# Patient Record
Sex: Female | Born: 2000
Health system: Southern US, Community
[De-identification: ages and names within clinical notes are randomized; demographics above are authoritative.]

## PROBLEM LIST (undated history)

## (undated) DIAGNOSIS — R51 Headache: Secondary | ICD-10-CM

## (undated) HISTORY — PX: TONGUE FLAP RELEASE: SHX2537

## (undated) HISTORY — DX: Headache: R51

---
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
## 2019-06-27 ENCOUNTER — Other Ambulatory Visit: Payer: Self-pay

## 2019-06-27 ENCOUNTER — Ambulatory Visit (HOSPITAL_COMMUNITY)
Admission: EM | Admit: 2019-06-27 | Discharge: 2019-06-27 | Disposition: A | Discharge status: Home / Self Care | Payer: 59 | Attending: Family Medicine | Admitting: Family Medicine

## 2019-06-27 ENCOUNTER — Encounter (HOSPITAL_COMMUNITY): Payer: Self-pay

## 2019-06-27 DIAGNOSIS — R1084 Generalized abdominal pain: Secondary | ICD-10-CM | POA: Insufficient documentation

## 2019-06-27 DIAGNOSIS — Z3202 Encounter for pregnancy test, result negative: Secondary | ICD-10-CM

## 2019-06-27 LAB — POCT PREGNANCY, URINE: Preg Test, Ur: NEGATIVE

## 2019-06-27 LAB — POCT URINALYSIS DIP (DEVICE)
Bilirubin Urine: NEGATIVE
Glucose, UA: NEGATIVE mg/dL
Hgb urine dipstick: NEGATIVE
Ketones, ur: NEGATIVE mg/dL
Leukocytes,Ua: NEGATIVE
Nitrite: NEGATIVE
Protein, ur: NEGATIVE mg/dL
Specific Gravity, Urine: 1.025 (ref 1.005–1.030)
Urobilinogen, UA: 1 mg/dL (ref 0.0–1.0)
pH: 7 (ref 5.0–8.0)

## 2019-06-27 LAB — POC URINE PREG, ED: Preg Test, Ur: NEGATIVE

## 2019-06-27 MED ORDER — OMEPRAZOLE 40 MG PO CPDR: 40.0000 mg | DELAYED_RELEASE_CAPSULE | Freq: Every day | ORAL | 0 refills | Status: AC

## 2019-06-27 NOTE — Discharge Instructions (Addendum)
Your exam is overall reassuring today.  Your urine does not indicate any urinary tract infection.  We will test your vagina again for BV as well as std's.  Please start daily omeprazole, we will see if this helps symptoms.  Please follow up with your primary care provider if symptoms persist as you may need further evaluation.  Limit use of ibuprofen or aleve (naproxen), use of tylenol as needed.  Please go to the Er if symptoms worsen at all- increased pain, fevers or otherwise worsening.

## 2019-06-27 NOTE — ED Triage Notes (Signed)
Pt presents with complaints of abdominal and vaginal pain of and on since October. Pt was diagnosed with gonorrhea, chlamydia and bacterial vaginosis in October and received treatment for it. She states the medicine for BV made her throw up and she is concerned she did not get completely treated.

## 2019-06-27 NOTE — ED Provider Notes (Signed)
[facility]    CSN: [id number] Arrival date & time: 06/27/19  1723      History   Chief Complaint Chief Complaint  Patient presents with  . Abdominal Pain    HPI Elizabeth Hill is a 18 y.o. female.   Elizabeth Hill presents with complaints of generalized abdominal pain. She has had episodes of this since may. In  May she did have a UTI and was treated with mild improvement. Returned in October, she was tested and treated for BV, chlamydia, gonorrha. Her partner had cheated on her. She has not been sexually active since May. No new partners. She was unable to complete course of flagyl for bv as it made her vomit. Over the past 4 days she has had pain worsening. She experiences intermittent pain that shoots to vagina, as well as generalized abdominal pain. Improves with eating. No nausea or vomiting. Normal bowel movements. No fevers. Hasn't taken any medications for symptoms. No urinary symptoms. Some back pain, history of scoliosis. No new vaginal symptoms. LMP 3 weeks ago and has been regular, she is taking oral birth control. She does have an appointment with her PCP in 10 days.   ROS per HPI, negative if not otherwise mentioned.      Past Medical History:  Diagnosis Date  . [id number](478.2Headache(784.0)     Patient Active Problem List   Diagnosis Date Noted  . Migraine without aura and without status migrainosus, not intractable 04/21/2016    Past Surgical History:  Procedure Laterality Date  . TONGUE FLAP RELEASE      OB History   No obstetric history on file.      Home Medications    Prior to Admission medications   Medication Sig Start Date End Date Taking? Authorizing Provider  APRI 0.15-30 MG-MCG tablet Take 1 tablet by mouth daily. 04/02/16   [provider]  HYDROcodone-acetaminophen (HYCET) 7.5-325 mg/15 ml solution Take 7.5 mLs by mouth 4 (four) times daily as needed for moderate pain. Patient not taking: Reported on 04/21/2016 10/03/13   Niel HummerKuhner,  Ross, MD  ibuprofen (ADVIL,MOTRIN) 200 MG tablet Take 200 mg by mouth every 6 (six) hours as needed for cramping.    [provider]  omeprazole (PRILOSEC) 40 MG capsule Take 1 capsule (40 mg total) by mouth daily. 06/27/19   Georgetta HaberBurky,  B, NP  ondansetron (ZOFRAN) 4 MG tablet Take 4 mg by mouth every 8 (eight) hours as needed for nausea.  10/01/13   [provider]  promethazine (PHENERGAN) 12.5 MG tablet 1-2 tablets as needed for headache/nausea. 11/08/16   Lorenz CoasterWolfe, Stephanie, MD  rizatriptan (MAXALT-MLT) 10 MG disintegrating tablet Take 1 tablet (10 mg total) by mouth as needed for migraine. May repeat in 2 hours if needed 11/08/16   Lorenz CoasterWolfe, Stephanie, MD    Family History Family History  Problem Relation Age of Onset  . Healthy Mother   . Healthy Father   . Diabetes Maternal Grandmother   . Heart disease Maternal Grandmother   . Hyperlipidemia Maternal Grandmother   . Hypertension Maternal Grandmother   . Migraines Maternal Grandmother   . Seizures Neg Hx   . Depression Neg Hx   . Anxiety disorder Neg Hx   . Bipolar disorder Neg Hx   . Schizophrenia Neg Hx   . ADD / ADHD Neg Hx   . Autism Neg Hx   . Drug abuse Neg Hx     Social History Social History   Tobacco Use  .  Smoking status: Never Smoker  . Smokeless tobacco: Never Used  Substance Use Topics  . Alcohol use: Not on file  . Drug use: Not on file     Allergies   Patient has no known allergies.   Review of Systems Review of Systems   Physical Exam Triage Vital Signs ED Triage Vitals  Enc Vitals Group     BP 06/27/19 1804 132/84     Pulse Rate 06/27/19 1804 89     Resp 06/27/19 1804 18     Temp 06/27/19 1804 98.7 F (37.1 C)     Temp src --      SpO2 06/27/19 1804 99 %     Weight --      Height --      Head Circumference --      Peak Flow --      Pain Score 06/27/19 1801 7     Pain Loc --      Pain Edu? --      Excl. in [place]? --    No data found.  Updated Vital Signs BP 132/84    Pulse 89   Temp 98.7 F (37.1 C)   Resp 18   LMP 05/28/2019   SpO2 99%    Physical Exam Constitutional:      General: She is not in acute distress.    Appearance: She is well-developed.  Cardiovascular:     Rate and Rhythm: Normal rate.  Pulmonary:     Effort: Pulmonary effort is normal.  Abdominal:     Palpations: Abdomen is soft.     Tenderness: There is generalized abdominal tenderness. There is no right CVA tenderness, left CVA tenderness, guarding or rebound.     Comments: No significant abdominal pain but generalized "pressure" with palpation to abdomen; some muscular tenderness to upper and lower back  Skin:    General: Skin is warm and dry.  Neurological:     Mental Status: She is alert and oriented to person, place, and time.      UC Treatments / Results  Labs (all labs ordered are listed, but only abnormal results are displayed) Labs Reviewed  POC URINE PREG, ED  POC URINE PREG, ED  POCT PREGNANCY, URINE  POCT URINALYSIS DIP (DEVICE)  CERVICOVAGINAL ANCILLARY ONLY    EKG   Radiology No results found.  Procedures Procedures (including critical care time)  Medications Ordered in UC Medications - No data to display  Initial Impression / Assessment and Plan / UC Course  I have reviewed the triage vital signs and the nursing notes.  Pertinent labs & imaging results that were available during my care of the patient were reviewed by me and considered in my medical decision making (see chart for details).     Intermittent abdominal symptoms for a few months now. No red flag findings. Vitals stable. UA without acute findings here tonight. Vaginal cytology collected and pending as well. Patient endorses significant stress related to school and her previous relationship, gastritis vs ulcer considered as well with omeprazole initiated. encouraged close follow up. Return precautions provided. Patient verbalized understanding and agreeable to plan.   Final  Clinical Impressions(s) / UC Diagnoses   Final diagnoses:  Generalized abdominal pain     Discharge Instructions     Your exam is overall reassuring today.  Your urine does not indicate any urinary tract infection.  We will test your vagina again for BV as well as std's.  Please start daily omeprazole, we  will see if this helps symptoms.  Please follow up with your primary care provider if symptoms persist as you may need further evaluation.  Limit use of ibuprofen or aleve (naproxen), use of tylenol as needed.  Please go to the Er if symptoms worsen at all- increased pain, fevers or otherwise worsening.    ED Prescriptions    Medication Sig Dispense Auth. Provider   omeprazole (PRILOSEC) 40 MG capsule Take 1 capsule (40 mg total) by mouth daily. 30 capsule Georgetta Haber, NP     PDMP not reviewed this encounter.   Georgetta Haber, NP 06/27/19 [phone]

## 2019-06-28 LAB — POCT URINALYSIS DIP (DEVICE)
Bilirubin Urine: NEGATIVE
Glucose, UA: NEGATIVE mg/dL
Hgb urine dipstick: NEGATIVE
Ketones, ur: NEGATIVE mg/dL
Leukocytes,Ua: NEGATIVE
Nitrite: NEGATIVE
Protein, ur: NEGATIVE mg/dL
Specific Gravity, Urine: 1.025 (ref 1.005–1.030)
Urobilinogen, UA: 1 mg/dL (ref 0.0–1.0)
pH: 7 (ref 5.0–8.0)

## 2019-07-03 LAB — CERVICOVAGINAL ANCILLARY ONLY
Bacterial vaginitis: NEGATIVE
Candida vaginitis: NEGATIVE
Chlamydia: NEGATIVE
Neisseria Gonorrhea: NEGATIVE
Trichomonas: NEGATIVE

## 2019-07-11 ENCOUNTER — Ambulatory Visit: Payer: 59 | Attending: Internal Medicine

## 2019-07-11 DIAGNOSIS — Z20822 Contact with and (suspected) exposure to covid-19: Secondary | ICD-10-CM

## 2019-07-12 LAB — NOVEL CORONAVIRUS, NAA: SARS-CoV-2, NAA: NOT DETECTED

## 2019-08-07 ENCOUNTER — Ambulatory Visit: Payer: 59 | Attending: Internal Medicine

## 2019-08-07 DIAGNOSIS — Z20822 Contact with and (suspected) exposure to covid-19: Secondary | ICD-10-CM

## 2019-08-08 LAB — NOVEL CORONAVIRUS, NAA: SARS-CoV-2, NAA: NOT DETECTED

## 2021-12-27 ENCOUNTER — Emergency Department (HOSPITAL_BASED_OUTPATIENT_CLINIC_OR_DEPARTMENT_OTHER)
Admission: EM | Admit: 2021-12-27 | Discharge: 2021-12-27 | Disposition: A | Discharge status: Home / Self Care | Payer: Commercial Managed Care - PPO | Attending: Emergency Medicine | Admitting: Emergency Medicine

## 2021-12-27 ENCOUNTER — Encounter (HOSPITAL_BASED_OUTPATIENT_CLINIC_OR_DEPARTMENT_OTHER): Payer: Self-pay | Admitting: Emergency Medicine

## 2021-12-27 ENCOUNTER — Other Ambulatory Visit: Payer: Self-pay

## 2021-12-27 DIAGNOSIS — R1011 Right upper quadrant pain: Secondary | ICD-10-CM | POA: Diagnosis not present

## 2021-12-27 DIAGNOSIS — R197 Diarrhea, unspecified: Secondary | ICD-10-CM | POA: Insufficient documentation

## 2021-12-27 DIAGNOSIS — R112 Nausea with vomiting, unspecified: Secondary | ICD-10-CM | POA: Insufficient documentation

## 2021-12-27 DIAGNOSIS — R109 Unspecified abdominal pain: Secondary | ICD-10-CM

## 2021-12-27 DIAGNOSIS — R1012 Left upper quadrant pain: Secondary | ICD-10-CM | POA: Insufficient documentation

## 2021-12-27 DIAGNOSIS — R1084 Generalized abdominal pain: Secondary | ICD-10-CM | POA: Diagnosis present

## 2021-12-27 LAB — COMPREHENSIVE METABOLIC PANEL
ALT: 11 U/L (ref 0–44)
AST: 21 U/L (ref 15–41)
Albumin: 3.3 g/dL — ABNORMAL LOW (ref 3.5–5.0)
Alkaline Phosphatase: 54 U/L (ref 38–126)
Anion gap: 5 (ref 5–15)
BUN: 11 mg/dL (ref 6–20)
CO2: 25 mmol/L (ref 22–32)
Calcium: 8.7 mg/dL — ABNORMAL LOW (ref 8.9–10.3)
Chloride: 107 mmol/L (ref 98–111)
Creatinine, Ser: 0.69 mg/dL (ref 0.44–1.00)
GFR, Estimated: >60 mL/min (ref >60)
Glucose, Bld: 134 mg/dL — ABNORMAL HIGH (ref 70–99)
Potassium: 3.9 mmol/L (ref 3.5–5.1)
Sodium: 137 mmol/L (ref 135–145)
Total Bilirubin: 0.4 mg/dL (ref 0.3–1.2)
Total Protein: 7.1 g/dL (ref 6.5–8.1)

## 2021-12-27 LAB — CBC WITH DIFFERENTIAL/PLATELET
Abs Immature Granulocytes: 0.03 10*3/uL (ref 0.00–0.07)
Basophils Absolute: 0 10*3/uL (ref 0.0–0.1)
Basophils Relative: 0 %
Eosinophils Absolute: 0.1 10*3/uL (ref 0.0–0.5)
Eosinophils Relative: 1 %
HCT: 35.6 % — ABNORMAL LOW (ref 36.0–46.0)
Hemoglobin: 11.4 g/dL — ABNORMAL LOW (ref 12.0–15.0)
Immature Granulocytes: 0 %
Lymphocytes Relative: 23 %
Lymphs Abs: 1.9 10*3/uL (ref 0.7–4.0)
MCH: 27.6 pg (ref 26.0–34.0)
MCHC: 32 g/dL (ref 30.0–36.0)
MCV: 86.2 fL (ref 80.0–100.0)
Monocytes Absolute: 0.6 10*3/uL (ref 0.1–1.0)
Monocytes Relative: 7 %
Neutro Abs: 5.9 10*3/uL (ref 1.7–7.7)
Neutrophils Relative %: 69 %
Platelets: 274 10*3/uL (ref 150–400)
RBC: 4.13 MIL/uL (ref 3.87–5.11)
RDW: 13.2 % (ref 11.5–15.5)
WBC: 8.6 10*3/uL (ref 4.0–10.5)
nRBC: 0 % (ref 0.0–0.2)

## 2021-12-27 LAB — URINALYSIS, ROUTINE W REFLEX MICROSCOPIC
Bilirubin Urine: NEGATIVE
Glucose, UA: NEGATIVE mg/dL
Hgb urine dipstick: NEGATIVE
Ketones, ur: NEGATIVE mg/dL
Leukocytes,Ua: NEGATIVE
Nitrite: NEGATIVE
Protein, ur: NEGATIVE mg/dL
Specific Gravity, Urine: 1.025 (ref 1.005–1.030)
pH: 6.5 (ref 5.0–8.0)

## 2021-12-27 LAB — PREGNANCY, URINE: Preg Test, Ur: NEGATIVE

## 2021-12-27 LAB — LIPASE, BLOOD: Lipase: 27 U/L (ref 11–51)

## 2021-12-27 MED ORDER — LIDOCAINE VISCOUS HCL 2 % MT SOLN
15.0000 mL | Freq: Once | OROMUCOSAL | Status: DC
  Filled 2021-12-27: qty 15

## 2021-12-27 MED ORDER — METHOCARBAMOL 500 MG PO TABS: 500.0000 mg | ORAL_TABLET | Freq: Two times a day (BID) | ORAL | 0 refills | Status: AC

## 2021-12-27 MED ORDER — DICYCLOMINE HCL 10 MG PO CAPS
10.0000 mg | ORAL_CAPSULE | Freq: Once | ORAL | Status: AC
  Administered 2021-12-27: 10 mg via ORAL
  Filled 2021-12-27: qty 1

## 2021-12-27 MED ORDER — DICYCLOMINE HCL 10 MG/5ML PO SOLN: 10.0000 mg | Freq: Once | ORAL | Status: DC

## 2021-12-27 MED ORDER — ALUM & MAG HYDROXIDE-SIMETH 200-200-20 MG/5ML PO SUSP
30.0000 mL | Freq: Once | ORAL | Status: DC
  Filled 2021-12-27: qty 30

## 2021-12-27 MED ORDER — ONDANSETRON 8 MG PO TBDP: 8.0000 mg | ORAL_TABLET | Freq: Three times a day (TID) | ORAL | 0 refills | Status: AC | PRN

## 2021-12-27 MED ORDER — ALUM & MAG HYDROXIDE-SIMETH 200-200-20 MG/5ML PO SUSP: 30.0000 mL | Freq: Once | ORAL | Status: DC

## 2021-12-27 NOTE — ED Notes (Signed)
Unable to obtain IV with blood draw

## 2021-12-27 NOTE — ED Provider Notes (Signed)
[facility] EMERGENCY DEPARTMENT Provider Note   CSN: [id number] Arrival date & time: 12/27/21  2058     History PMH: n/a  Chief Complaint  Patient presents with   Flank Pain    Elizabeth Hill is a 21 y.o. female. Presents with bilateral flank pain that is been ongoing for about 1 week.  She says it initially started out intermittent we will come and go, but since yesterday has been constant.  She describes as a cramping sensation in her bilateral sides as well as some mild generalized abdominal tenderness that is more prominent in her upper abdomen.  She did have a couple episodes of nausea and vomiting today and last night.  She is also been having some intermittent diarrhea.  She does state that she feels a lot of pressure when she urinates.  She denies any fevers, chills, chest pain, shortness of breath, dysuria, hematuria, constipation, numbness of lower extremities, gait disturbance, bowel or bladder incontinence or retention, saddle anesthesia. No history of IVDU.    Flank Pain Associated symptoms include abdominal pain. Pertinent negatives include no chest pain and no shortness of breath.       Home Medications Prior to Admission medications   Medication Sig Start Date End Date Taking? Authorizing Provider  methocarbamol (ROBAXIN) 500 MG tablet Take 1 tablet (500 mg total) by mouth 2 (two) times daily for 7 days. 12/27/21 01/03/22 Yes Fizza Scales, Finis Bud, PA-C  ondansetron (ZOFRAN-ODT) 8 MG disintegrating tablet Take 1 tablet (8 mg total) by mouth every 8 (eight) hours as needed for up to 7 days for nausea or vomiting. 12/27/21 01/03/22 Yes Shallon Yaklin, Finis Bud, PA-C  APRI 0.15-30 MG-MCG tablet Take 1 tablet by mouth daily. 04/02/16   [provider]  HYDROcodone-acetaminophen (HYCET) 7.5-325 mg/15 ml solution Take 7.5 mLs by mouth 4 (four) times daily as needed for moderate pain. Patient not taking: Reported on 04/21/2016 10/03/13   Niel Hummer, MD  ibuprofen  (ADVIL,MOTRIN) 200 MG tablet Take 200 mg by mouth every 6 (six) hours as needed for cramping.    [provider]  omeprazole (PRILOSEC) 40 MG capsule Take 1 capsule (40 mg total) by mouth daily. 06/27/19   Georgetta Haber, NP  ondansetron (ZOFRAN) 4 MG tablet Take 4 mg by mouth every 8 (eight) hours as needed for nausea.  10/01/13   [provider]  promethazine (PHENERGAN) 12.5 MG tablet 1-2 tablets as needed for headache/nausea. 11/08/16   Margurite Auerbach, MD  rizatriptan (MAXALT-MLT) 10 MG disintegrating tablet Take 1 tablet (10 mg total) by mouth as needed for migraine. May repeat in 2 hours if needed 11/08/16   Margurite Auerbach, MD      Allergies    Patient has no known allergies.    Review of Systems   Review of Systems  Constitutional:  Positive for fatigue. Negative for chills and fever.  Respiratory:  Negative for shortness of breath.   Cardiovascular:  Negative for chest pain.  Gastrointestinal:  Positive for abdominal pain, diarrhea, nausea and vomiting.  Genitourinary:  Positive for flank pain.  Musculoskeletal:  Positive for back pain.  All other systems reviewed and are negative.   Physical Exam Updated Vital Signs BP 114/76 (BP Location: Right Arm)   Pulse 80   Temp 98.4 F (36.9 C) (Oral)   Resp 18   Ht 5\' 4"  (1.626 m)   Wt 68 kg   LMP 12/02/2021   SpO2 100%   BMI 25.75 kg/m  Physical Exam Vitals and nursing note reviewed.  Constitutional:      General: She is not in acute distress.    Appearance: Normal appearance. She is not ill-appearing, toxic-appearing or diaphoretic.  HENT:     Head: Normocephalic and atraumatic.     Nose: No nasal deformity.     Mouth/Throat:     Lips: Pink. No lesions.     Mouth: No injury, lacerations, oral lesions or angioedema.     Pharynx: Uvula midline. No pharyngeal swelling or uvula swelling.  Eyes:     General: Gaze aligned appropriately. No scleral icterus.       Right eye: No discharge.        Left  eye: No discharge.     Conjunctiva/sclera: Conjunctivae normal.     Right eye: Right conjunctiva is not injected. No exudate or hemorrhage.    Left eye: Left conjunctiva is not injected. No exudate or hemorrhage. Cardiovascular:     Rate and Rhythm: Normal rate and regular rhythm.     Pulses: Normal pulses.          Radial pulses are 2+ on the right side and 2+ on the left side.       Dorsalis pedis pulses are 2+ on the right side and 2+ on the left side.     Heart sounds: Normal heart sounds, S1 normal and S2 normal. Heart sounds not distant. No murmur heard.    No friction rub. No gallop. No S3 or S4 sounds.  Pulmonary:     Effort: Pulmonary effort is normal. No accessory muscle usage or respiratory distress.     Breath sounds: Normal breath sounds. No stridor. No wheezing, rhonchi or rales.  Chest:     Chest wall: No tenderness.  Abdominal:     General: Abdomen is flat. There is no distension.     Palpations: Abdomen is soft. There is no mass or pulsatile mass.     Tenderness: There is abdominal tenderness. There is right CVA tenderness and left CVA tenderness. There is no guarding or rebound.     Comments: Mild to moderate generalized abdominal tenderness. More prominent in upper abdomen. Murphy sign negative.Mcburney sign negative.  Musculoskeletal:     Right lower leg: No edema.     Left lower leg: No edema.     Comments: No midline tenderness of spine, no stepoff or deformity; reproducible muscular tenderness in bilateral paraspinal muscles DP/PT pulses 2+ and equal bilaterally No leg edema Sensation grossly intact on anterior thighs, dorsum of foot and lateral foot Strength of knee flexion and extension is 5/5 Plantar and dorsiflexion of ankle 5/5 Gait normal   Skin:    General: Skin is warm and dry.     Coloration: Skin is not jaundiced or pale.     Findings: No bruising, erythema, lesion or rash.  Neurological:     General: No focal deficit present.     Mental Status:  She is alert and oriented to person, place, and time.     GCS: GCS eye subscore is 4. GCS verbal subscore is 5. GCS motor subscore is 6.  Psychiatric:        Mood and Affect: Mood normal.        Behavior: Behavior normal. Behavior is cooperative.     ED Results / Procedures / Treatments   Labs (all labs ordered are listed, but only abnormal results are displayed) Labs Reviewed  CBC WITH DIFFERENTIAL/PLATELET - Abnormal; Notable for the following  components:      Result Value   Hemoglobin 11.4 (*)    HCT 35.6 (*)    All other components within normal limits  COMPREHENSIVE METABOLIC PANEL - Abnormal; Notable for the following components:   Glucose, Bld 134 (*)    Calcium 8.7 (*)    Albumin 3.3 (*)    All other components within normal limits  URINALYSIS, ROUTINE W REFLEX MICROSCOPIC  PREGNANCY, URINE  LIPASE, BLOOD    EKG None  Radiology No results found.  Procedures Procedures   Medications Ordered in ED Medications  alum & mag hydroxide-simeth (MAALOX/MYLANTA) 200-200-20 MG/5ML suspension 30 mL (30 mLs Oral Not Given 12/27/21 2148)    And  lidocaine (XYLOCAINE) 2 % viscous mouth solution 15 mL (15 mLs Oral Not Given 12/27/21 2148)  dicyclomine (BENTYL) capsule 10 mg (10 mg Oral Given 12/27/21 2147)    ED Course/ Medical Decision Making/ A&P                           Medical Decision Making Amount and/or Complexity of Data Reviewed Labs: ordered.  Risk OTC drugs. Prescription drug management.    MDM  This is a 21 y.o. female who presents to the ED with bilateral flank pain, nausea, vomiting, and diarrhea The differential of this patient includes but is not limited to gastroenteritis, pyelonephritis, UTI, PUD, urinary retention, intraabdominal pathology, lumbar strain  My Impression, Plan, and ED Course:  Patient is well-appearing.  She is afebrile and hemodynamically stable.  She presents with multiple complaints of worsening bilateral flank pain as well as  generalized abdominal pain, nausea, vomiting, and intermittent diarrhea. Bladder scan to check for urine retaining Ordering labs with abdominal symptoms.  I personally ordered, reviewed, and interpreted all laboratory work and imaging and agree with radiologist interpretation. Results interpreted below:  BC without leukocytosis.  Hemoglobin 11.4 stable from baseline.  No other abnormalities.  CMP is overall unremarkable.  Lipase 27.  Pregnancy negative.  Urinalysis suggest no evidence of infection. Bladder scan without evidence of retained urine   Unrevealing work-up.  Abdominal exam and stable with continued no focal tenderness.  With vague GI complaints, she was given a GI cocktail which improved her symptoms slightly. I do not think that she needs advanced imaging at this time as her vitals are stable, labs are reassuring, no focal tenderness, and tolerating PO. Recommend follow up with PCP.   Charting Requirements Additional history is obtained from:  Independent historian External Records from outside source obtained and reviewed including: Reviewed prior labs Social Determinants of Health:  none Pertinant PMH that complicates patient's illness: n/a  Patient Care Problems that were addressed during this visit: - bilateral flank pain: Acute illness with systemic symptoms Medications given in ED: GI cocktail Reevaluation of the patient after these medicines showed that the patient improved I have reviewed home medications and made changes accordingly.  Critical Care Interventions: n/a Consultations: n/a Disposition: discharge. Pcp f/u  Portions of this note were generated with Dragon dictation software. Dictation errors may occur despite best attempts at proofreading.  Final Clinical Impression(s) / ED Diagnoses Final diagnoses:  Bilateral flank pain    Rx / DC Orders ED Discharge Orders          Ordered    methocarbamol (ROBAXIN) 500 MG tablet  2 times daily        12/27/21  2305    ondansetron (ZOFRAN-ODT) 8 MG disintegrating tablet  Every  8 hours PRN        12/27/21 2307              Therese Sarah 12/28/21 0008    Rolan Bucco, MD 12/30/21 1558

## 2021-12-27 NOTE — Discharge Instructions (Addendum)
Your labs and urine sample today were very reassuring.  Since you are having associated nausea and other GI issues, I have prescribed zofran to help with this. Please follow up with your primary care doctor.  If you become unable to keep food down, your pain significantly worsens, or you develop fevers, you should return to the emergency department for reevaluation  I have also sent in the prescription for a muscle relaxer that they were planning on trying for you to see if it helps with some of your symptoms.

## 2021-12-27 NOTE — ED Triage Notes (Signed)
Reports bil flank pain that has been going on for about a week.  Denies any urinary symptoms.  Taking ibuprofen with no relief.

## 2021-12-28 NOTE — ED Provider Notes (Incomplete)
[facility] EMERGENCY DEPARTMENT Provider Note   CSN: [id number] Arrival date & time: 12/27/21  2058     History PMH: n/a  Chief Complaint  Patient presents with   Flank Pain    Elizabeth Hill is a 20 y.o. female. Presents with bilateral flank pain that is been ongoing for about 1 week.  She says it initially started out intermittent we will come and go, but since yesterday has been constant.  She describes as a cramping sensation in her bilateral sides as well as some mild generalized abdominal tenderness that is more prominent in her upper abdomen.  She did have a couple episodes of nausea and vomiting today and last night.  She is also been having some intermittent diarrhea.  She does state that she feels a lot of pressure when she urinates.  She denies any fevers, chills, chest pain, shortness of breath, dysuria, hematuria, constipation, numbness of lower extremities, gait disturbance, bowel or bladder incontinence or retention, saddle anesthesia. No history of IVDU.    Flank Pain Associated symptoms include abdominal pain. Pertinent negatives include no chest pain and no shortness of breath.       Home Medications Prior to Admission medications   Medication Sig Start Date End Date Taking? Authorizing Provider  methocarbamol (ROBAXIN) 500 MG tablet Take 1 tablet (500 mg total) by mouth 2 (two) times daily for 7 days. 12/27/21 01/03/22 Yes Fusae Florio C, PA-C  ondansetron (ZOFRAN-ODT) 8 MG disintegrating tablet Take 1 tablet (8 mg total) by mouth every 8 (eight) hours as needed for up to 7 days for nausea or vomiting. 12/27/21 01/03/22 Yes Mitsuye Schrodt C, PA-C  APRI 0.15-30 MG-MCG tablet Take 1 tablet by mouth daily. 04/02/16   [provider]  HYDROcodone-acetaminophen (HYCET) 7.5-325 mg/15 ml solution Take 7.5 mLs by mouth 4 (four) times daily as needed for moderate pain. Patient not taking: Reported on 04/21/2016 10/03/13   Kuhner, Ross, MD  ibuprofen  (ADVIL,MOTRIN) 200 MG tablet Take 200 mg by mouth every 6 (six) hours as needed for cramping.    [provider]  omeprazole (PRILOSEC) 40 MG capsule Take 1 capsule (40 mg total) by mouth daily. 06/27/19   Burky, Natalie B, NP  ondansetron (ZOFRAN) 4 MG tablet Take 4 mg by mouth every 8 (eight) hours as needed for nausea.  10/01/13   [provider]  promethazine (PHENERGAN) 12.5 MG tablet 1-2 tablets as needed for headache/nausea. 11/08/16   Wolfe, Stephanie M, MD  rizatriptan (MAXALT-MLT) 10 MG disintegrating tablet Take 1 tablet (10 mg total) by mouth as needed for migraine. May repeat in 2 hours if needed 11/08/16   Wolfe, Stephanie M, MD      Allergies    Patient has no known allergies.    Review of Systems   Review of Systems  Constitutional:  Positive for fatigue. Negative for chills and fever.  Respiratory:  Negative for shortness of breath.   Cardiovascular:  Negative for chest pain.  Gastrointestinal:  Positive for abdominal pain, diarrhea, nausea and vomiting.  Genitourinary:  Positive for flank pain.  Musculoskeletal:  Positive for back pain.  All other systems reviewed and are negative.   Physical Exam Updated Vital Signs BP 114/76 (BP Location: Right Arm)   Pulse 80   Temp 98.4 F (36.9 C) (Oral)   Resp 18   Ht 5' 4" (1.626 m)   Wt 68 kg   LMP 12/02/2021   SpO2 100%   BMI 25.75 kg/m    Physical Exam Vitals and nursing note reviewed.  Constitutional:      General: She is not in acute distress.    Appearance: Normal appearance. She is not ill-appearing, toxic-appearing or diaphoretic.  HENT:     Head: Normocephalic and atraumatic.     Nose: No nasal deformity.     Mouth/Throat:     Lips: Pink. No lesions.     Mouth: No injury, lacerations, oral lesions or angioedema.     Pharynx: Uvula midline. No pharyngeal swelling or uvula swelling.  Eyes:     General: Gaze aligned appropriately. No scleral icterus.       Right eye: No discharge.        Left  eye: No discharge.     Conjunctiva/sclera: Conjunctivae normal.     Right eye: Right conjunctiva is not injected. No exudate or hemorrhage.    Left eye: Left conjunctiva is not injected. No exudate or hemorrhage. Cardiovascular:     Rate and Rhythm: Normal rate and regular rhythm.     Pulses: Normal pulses.          Radial pulses are 2+ on the right side and 2+ on the left side.       Dorsalis pedis pulses are 2+ on the right side and 2+ on the left side.     Heart sounds: Normal heart sounds, S1 normal and S2 normal. Heart sounds not distant. No murmur heard.    No friction rub. No gallop. No S3 or S4 sounds.  Pulmonary:     Effort: Pulmonary effort is normal. No accessory muscle usage or respiratory distress.     Breath sounds: Normal breath sounds. No stridor. No wheezing, rhonchi or rales.  Chest:     Chest wall: No tenderness.  Abdominal:     General: Abdomen is flat. There is no distension.     Palpations: Abdomen is soft. There is no mass or pulsatile mass.     Tenderness: There is abdominal tenderness. There is right CVA tenderness and left CVA tenderness. There is no guarding or rebound.     Comments: Mild to moderate generalized abdominal tenderness. More prominent in upper abdomen. Murphy sign negative.Mcburney sign negative.  Musculoskeletal:     Right lower leg: No edema.     Left lower leg: No edema.     Comments: No midline tenderness of spine, no stepoff or deformity; reproducible muscular tenderness in bilateral paraspinal muscles DP/PT pulses 2+ and equal bilaterally No leg edema Sensation grossly intact on anterior thighs, dorsum of foot and lateral foot Strength of knee flexion and extension is 5/5 Plantar and dorsiflexion of ankle 5/5 Gait normal   Skin:    General: Skin is warm and dry.     Coloration: Skin is not jaundiced or pale.     Findings: No bruising, erythema, lesion or rash.  Neurological:     General: No focal deficit present.     Mental Status:  She is alert and oriented to person, place, and time.     GCS: GCS eye subscore is 4. GCS verbal subscore is 5. GCS motor subscore is 6.  Psychiatric:        Mood and Affect: Mood normal.        Behavior: Behavior normal. Behavior is cooperative.     ED Results / Procedures / Treatments   Labs (all labs ordered are listed, but only abnormal results are displayed) Labs Reviewed  CBC WITH DIFFERENTIAL/PLATELET - Abnormal; Notable for the following   components:      Result Value   Hemoglobin 11.4 (*)    HCT 35.6 (*)    All other components within normal limits  COMPREHENSIVE METABOLIC PANEL - Abnormal; Notable for the following components:   Glucose, Bld 134 (*)    Calcium 8.7 (*)    Albumin 3.3 (*)    All other components within normal limits  URINALYSIS, ROUTINE W REFLEX MICROSCOPIC  PREGNANCY, URINE  LIPASE, BLOOD    EKG None  Radiology No results found.  Procedures Procedures   Medications Ordered in ED Medications  alum & mag hydroxide-simeth (MAALOX/MYLANTA) 200-200-20 MG/5ML suspension 30 mL (30 mLs Oral Not Given 12/27/21 2148)    And  lidocaine (XYLOCAINE) 2 % viscous mouth solution 15 mL (15 mLs Oral Not Given 12/27/21 2148)  dicyclomine (BENTYL) capsule 10 mg (10 mg Oral Given 12/27/21 2147)    ED Course/ Medical Decision Making/ A&P                           Medical Decision Making Amount and/or Complexity of Data Reviewed Labs: ordered.  Risk OTC drugs. Prescription drug management.    MDM  This is a 20 y.o. female who presents to the ED with bilateral flank pain, nausea, vomiting, and diarrhea The differential of this patient includes but is not limited to gastroenteritis, pyelonephritis, UTI, PUD, urinary retention, intraabdominal pathology, lumbar strain  My Impression, Plan, and ED Course:  Patient is well-appearing.  She is afebrile and hemodynamically stable.  She presents with multiple complaints of worsening bilateral flank pain as well as  generalized abdominal pain, nausea, vomiting, and intermittent diarrhea. Bladder scan to check for urine retaining Ordering labs with abdominal symptoms.  I personally ordered, reviewed, and interpreted all laboratory work and imaging and agree with radiologist interpretation. Results interpreted below:  BC without leukocytosis.  Hemoglobin 11.4 stable from baseline.  No other abnormalities.  CMP is overall unremarkable.  Lipase 27.  Pregnancy negative.  Urinalysis suggest no evidence of infection. Bladder scan without evidence of retained urine   Unrevealing work-up.  Abdominal exam and stable with continued no focal tenderness.  With vague GI complaints, she was given a GI cocktail which improved her symptoms slightly. I do not think that she needs advanced imaging at this time as her vitals are stable, labs are reassuring, no focal tenderness, and tolerating PO. Recommend follow up with PCP.   Charting Requirements Additional history is obtained from:  Independent historian External Records from outside source obtained and reviewed including: Reviewed prior labs Social Determinants of Health:  none Pertinant PMH that complicates patient's illness: n/a  Patient Care Problems that were addressed during this visit: - bilateral flank pain: Acute illness with systemic symptoms Medications given in ED: GI cocktail Reevaluation of the patient after these medicines showed that the patient improved I have reviewed home medications and made changes accordingly.  Critical Care Interventions: n/a Consultations: n/a Disposition: discharge. Pcp f/u  Portions of this note were generated with Dragon dictation software. Dictation errors may occur despite best attempts at proofreading.  Final Clinical Impression(s) / ED Diagnoses Final diagnoses:  Bilateral flank pain    Rx / DC Orders ED Discharge Orders          Ordered    methocarbamol (ROBAXIN) 500 MG tablet  2 times daily        12/27/21  2305    ondansetron (ZOFRAN-ODT) 8 MG disintegrating tablet  Every
